# Patient Record
Sex: Male | Born: 1945 | Race: White | Hispanic: No
Health system: Midwestern US, Academic
[De-identification: ages and names within clinical notes are randomized; demographics above are authoritative.]

---

## 2017-02-26 ENCOUNTER — Encounter: Admit: 2017-02-26 | Discharge: 2017-02-26 | Payer: MEDICARE

## 2017-02-26 DIAGNOSIS — E7849 Other hyperlipidemia: ICD-10-CM

## 2017-02-26 DIAGNOSIS — R000 Tachycardia, unspecified: Principal | ICD-10-CM

## 2017-02-26 DIAGNOSIS — I2510 Atherosclerotic heart disease of native coronary artery without angina pectoris: ICD-10-CM

## 2017-02-28 ENCOUNTER — Encounter: Admit: 2017-02-28 | Discharge: 2017-02-28 | Payer: MEDICARE

## 2017-02-28 DIAGNOSIS — E119 Type 2 diabetes mellitus without complications: ICD-10-CM

## 2017-02-28 DIAGNOSIS — I2510 Atherosclerotic heart disease of native coronary artery without angina pectoris: ICD-10-CM

## 2017-02-28 DIAGNOSIS — Z87891 Personal history of nicotine dependence: Principal | ICD-10-CM

## 2017-02-28 DIAGNOSIS — R000 Tachycardia, unspecified: ICD-10-CM

## 2017-03-01 ENCOUNTER — Encounter: Admit: 2017-03-01 | Discharge: 2017-03-01 | Payer: MEDICARE

## 2017-03-01 DIAGNOSIS — E119 Type 2 diabetes mellitus without complications: ICD-10-CM

## 2017-03-01 DIAGNOSIS — I2510 Atherosclerotic heart disease of native coronary artery without angina pectoris: ICD-10-CM

## 2017-03-01 DIAGNOSIS — R000 Tachycardia, unspecified: ICD-10-CM

## 2017-03-01 DIAGNOSIS — Z87891 Personal history of nicotine dependence: Principal | ICD-10-CM

## 2017-03-01 NOTE — Progress Notes
Records Request    Medical records request for continuation of care:    Patient has appointment on 03/07/17   with  Dr. Trudi Ida* .    Please fax records to [facility]  [phone]    Request records: STAT    Cardiac Office Notes (Most recent)    EKG's           Cardiac Catheterization    Any Cardiac Testing    Any cardiac-related records    Recent Labs    H&P/Discharge Summary    Operative reports- Cardiac      Thank you,      [facility]  [facility]  [address]  [place], [place] [address]  Phone:  [phone]  Fax:  [phone]

## 2017-03-05 ENCOUNTER — Ambulatory Visit: Admit: 2017-03-05 | Discharge: 2017-03-06 | Payer: MEDICARE

## 2017-03-05 ENCOUNTER — Encounter: Admit: 2017-03-05 | Discharge: 2017-03-05 | Payer: MEDICARE

## 2017-03-05 DIAGNOSIS — R5382 Chronic fatigue, unspecified: ICD-10-CM

## 2017-03-05 DIAGNOSIS — I2510 Atherosclerotic heart disease of native coronary artery without angina pectoris: ICD-10-CM

## 2017-03-05 DIAGNOSIS — Z87891 Personal history of nicotine dependence: Principal | ICD-10-CM

## 2017-03-05 DIAGNOSIS — R000 Tachycardia, unspecified: ICD-10-CM

## 2017-03-05 DIAGNOSIS — E119 Type 2 diabetes mellitus without complications: ICD-10-CM

## 2017-03-05 DIAGNOSIS — R0600 Dyspnea, unspecified: ICD-10-CM

## 2017-03-05 DIAGNOSIS — R079 Chest pain, unspecified: ICD-10-CM

## 2017-03-07 ENCOUNTER — Encounter: Admit: 2017-03-07 | Discharge: 2017-03-07 | Payer: MEDICARE

## 2017-03-15 ENCOUNTER — Ambulatory Visit: Admit: 2017-03-15 | Discharge: 2017-03-16 | Payer: MEDICARE

## 2017-03-15 DIAGNOSIS — I2510 Atherosclerotic heart disease of native coronary artery without angina pectoris: Principal | ICD-10-CM

## 2017-03-15 DIAGNOSIS — R079 Chest pain, unspecified: ICD-10-CM

## 2017-03-15 NOTE — Progress Notes
Peripheral IV Insertion Note:  Patient Side: right  Line Orientation:Antecubital  IV Catheter Size: 22G  Number of Attempts:1.  IV capped and flushed with Normal Saline.  IV site without redness, swelling, or pain.  New dressing placed.    After procedure IV cannula removed intact and hemostasis achieved.

## 2017-03-19 ENCOUNTER — Encounter: Admit: 2017-03-19 | Discharge: 2017-03-19 | Payer: MEDICARE

## 2017-03-19 NOTE — Telephone Encounter
Called patient and reviewed results and recommendations.  He states he is NOT going to schedule the cath at this time.  I discussed at length the risks and still wants to hold off scheduling.  I encouraged him to stop smoking.  He reports he is feeling good and does not want to proceed with the cath.  He will call us when he is ready to schedule.   I reinforced if he has any sx to report to ER.  He verbalizes understanding.   Note sent to DJW.         Jeri Modena:   I am requesting your help in setting up a heart cath on Mr. James Roberts. I saw him on October 2. He has numerous risk factors and had recent thallium that was abnormal and suggestive of coronary disease.      I think the next best option is to see if he does actually have significant CAD. Could you all contact him and help arrange this. I will call him this afternoon and let him know that one of you will be contacting to help with these      Arrangements.    Thx    ///   DJW

## 2018-03-25 ENCOUNTER — Encounter: Admit: 2018-03-25 | Discharge: 2018-03-25 | Payer: MEDICARE

## 2019-02-20 IMAGING — CR CHEST
2 series · 2 of 2 positions shown · non-contrast
Comparison: none

[chest pa x-wise]
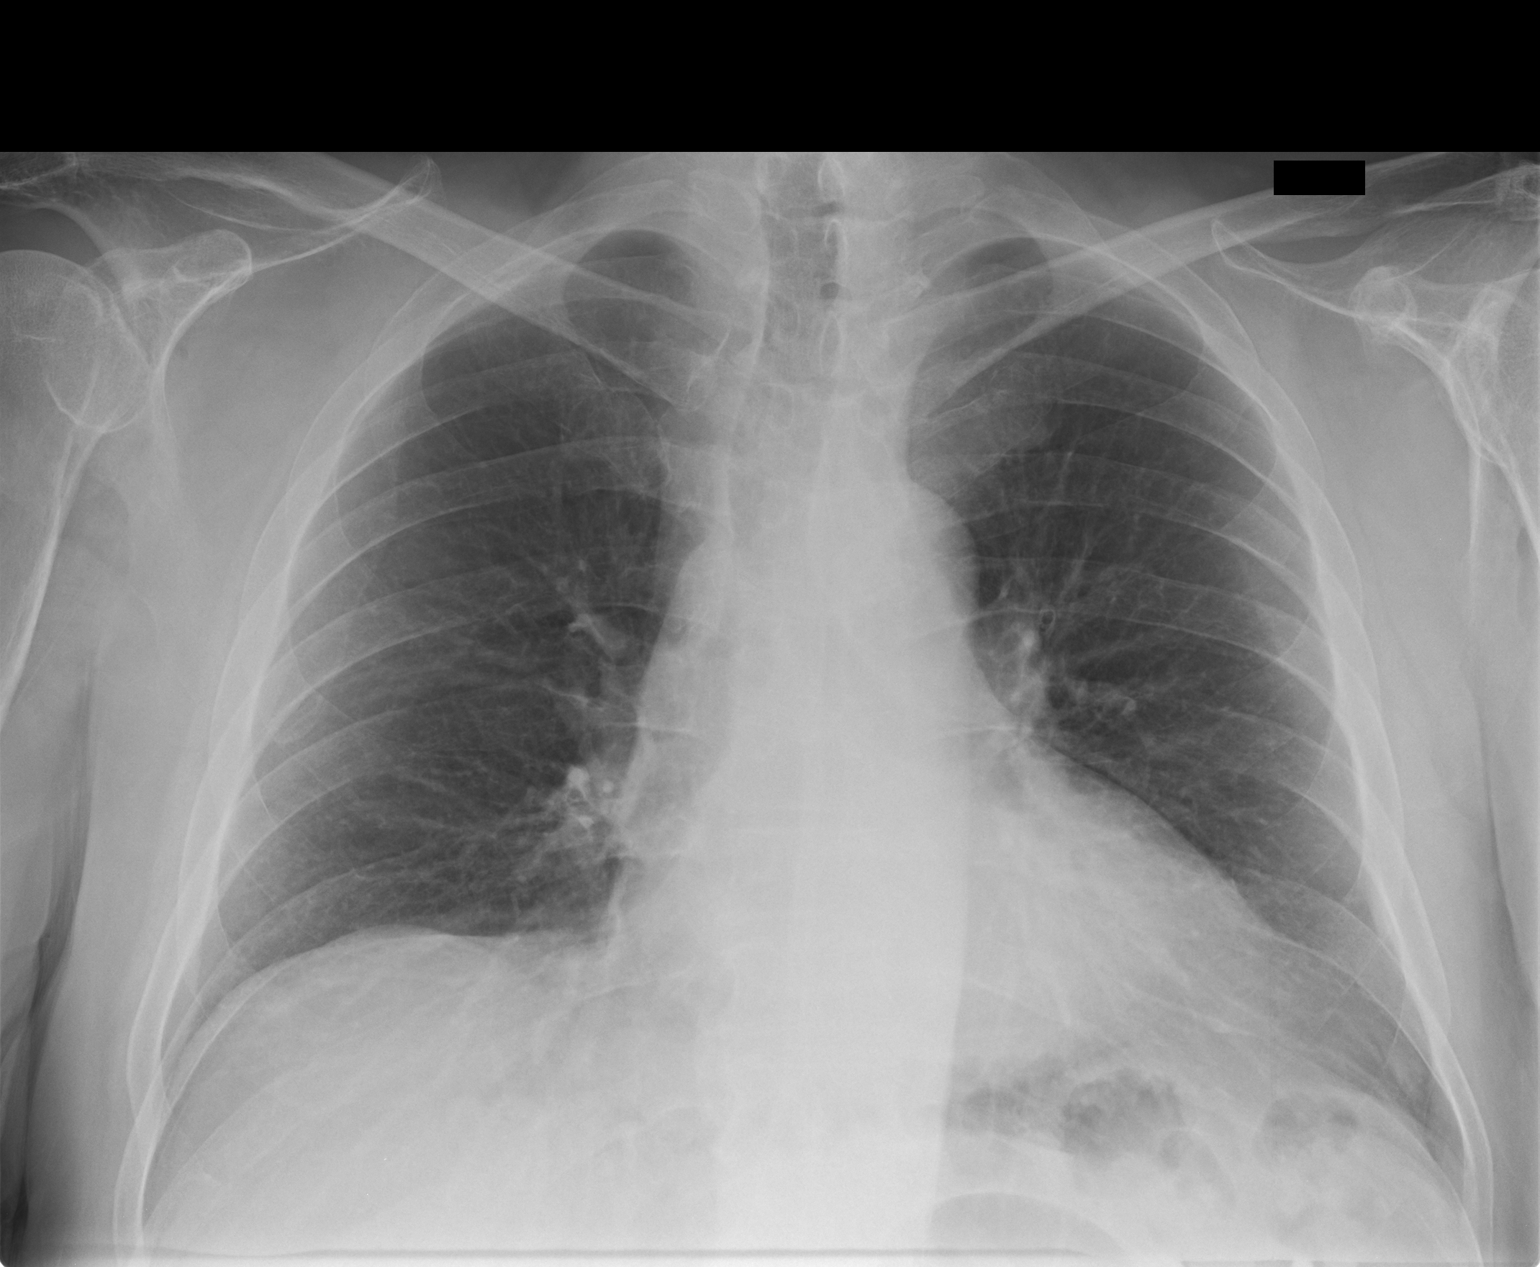

[chest lat]
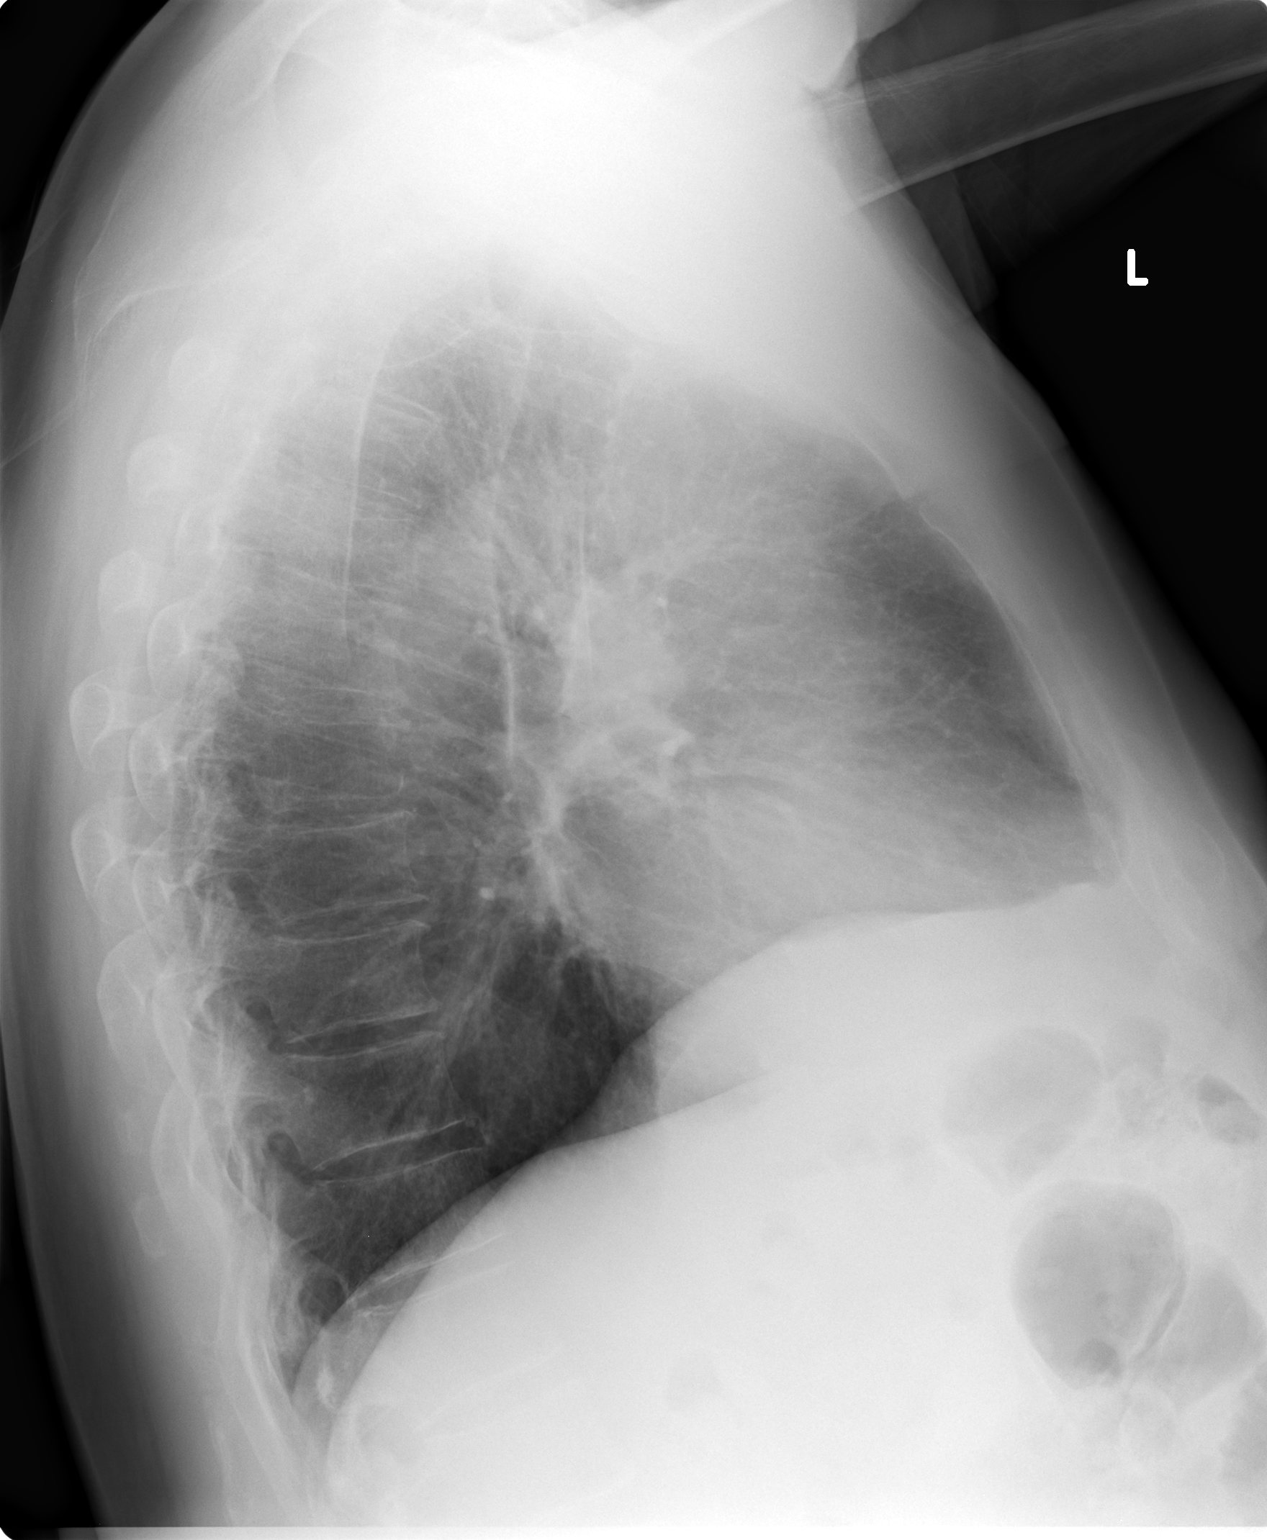

[2 of 2 positions shown; findings below may reference images not displayed]

DIAGNOSTIC STUDIES

EXAM

RADIOLOGICAL EXAMINATION, CHEST; 2 VIEWS FRONTAL AND LATERAL CPT 14565

INDICATION

cough
COUGH; HX OF SMOKING

TECHNIQUE

PA and lateral view chest

COMPARISONS

No prior studies are available for comparison.

FINDINGS

There is hyperinflation and chronic appearing interstitial changes. There is no focal consolidation
, effusion, or pneumothorax. Cardiac silhouette is within normal limits. The bony thorax is intact.

IMPRESSION

Mild chronic interstitial changes without evidence of dense consolidation.

## 2019-10-29 ENCOUNTER — Encounter: Admit: 2019-10-29 | Discharge: 2019-10-29 | Payer: MEDICARE

## 2019-10-29 DIAGNOSIS — R69 Illness, unspecified: Secondary | ICD-10-CM

## 2019-10-29 DIAGNOSIS — G9389 Other specified disorders of brain: Secondary | ICD-10-CM

## 2019-10-29 MED ORDER — ROSUVASTATIN 10 MG PO TAB
10 mg | Freq: Every day | ORAL | 0 refills | Status: DC
Start: 2019-10-29 — End: 2019-10-30
  Administered 2019-10-30: 15:00:00 10 mg via ORAL

## 2019-10-29 MED ORDER — ACETAMINOPHEN 325 MG PO TAB
650 mg | ORAL | 0 refills | Status: DC | PRN
Start: 2019-10-29 — End: 2019-10-30

## 2019-10-29 MED ORDER — MONTELUKAST 10 MG PO TAB
10 mg | Freq: Every evening | ORAL | 0 refills | Status: DC
Start: 2019-10-29 — End: 2019-10-30
  Administered 2019-10-30: 04:00:00 10 mg via ORAL

## 2019-10-29 MED ORDER — LISINOPRIL 10 MG PO TAB
5 mg | Freq: Every day | ORAL | 0 refills | Status: DC
Start: 2019-10-29 — End: 2019-10-30
  Administered 2019-10-30: 15:00:00 5 mg via ORAL

## 2019-10-29 MED ORDER — CHOLECALCIFEROL (VITAMIN D3) 25 MCG (1,000 UNIT) PO TAB
2000 [IU] | Freq: Every day | ORAL | 0 refills | Status: DC
Start: 2019-10-29 — End: 2019-10-30
  Administered 2019-10-30: 15:00:00 2000 [IU] via ORAL

## 2019-10-29 MED ORDER — CETIRIZINE 10 MG PO TAB
10 mg | Freq: Two times a day (BID) | ORAL | 0 refills | Status: DC
Start: 2019-10-29 — End: 2019-10-30
  Administered 2019-10-30 (×2): 10 mg via ORAL

## 2019-10-29 NOTE — Telephone Encounter
NEUROSURGERY ON CALL PHONE NOTE    I spoke to the [facility] regarding this 74 y.o. male who presented with intermittent right arm/leg weakness/weakness, dizziness. He presented to ED at [facility]. I reviewed images on the cloud. The quality is very poor. There is some kind of grid pattern superimposed over the image which obscures the detail. There is an abnormality of some kind in the left parietal lobe - tumor, stroke, abscess, etc. His spells sound most c/w seizure. He needs better quality imaging to determine if there is a neurosurgical problem present (MRI w/wo Gad seems like best choice). Transfer team plans to reach out to neurology (vs medicine) to discuss accepting the patient in transfer. I'll be happy to see after MRI if there's a neurosurgical indication.    Maryruth Eve, MD [facility]  Assistant Professor - Neurosurgery  [facility]  O: [phone]  F: [phone]  P: [phone]

## 2019-10-29 NOTE — Progress Notes
Neurological s/s including: numbness and inability to use right upper and right lower extremity, loss of balance, dizziness/x1 month. 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

## 2019-10-30 ENCOUNTER — Encounter: Admit: 2019-10-30 | Discharge: 2019-10-29 | Payer: MEDICARE

## 2019-10-30 ENCOUNTER — Inpatient Hospital Stay: Admit: 2019-10-30 | Discharge: 2019-10-30 | Payer: MEDICARE

## 2019-10-30 ENCOUNTER — Encounter: Admit: 2019-10-30 | Discharge: 2019-10-30 | Payer: MEDICARE

## 2019-10-30 ENCOUNTER — Inpatient Hospital Stay: Admit: 2019-10-30 | Payer: MEDICARE

## 2019-10-30 LAB — CBC AND DIFF
Lab: 0 10*3/uL (ref 0–0.20)
Lab: 0.3 10*3/uL (ref 0–0.45)
Lab: 1 % (ref 0–2)
Lab: 1.1 10*3/uL — ABNORMAL HIGH (ref 0–0.80)
Lab: 1.8 10*3/uL (ref 1.0–4.8)
Lab: 12 10*3/uL — ABNORMAL HIGH (ref 4.5–11.0)
Lab: 3 % (ref 0–5)
Lab: 8.6 10*3/uL — ABNORMAL HIGH (ref 1.8–7.0)
Lab: 9 % (ref 4–12)

## 2019-10-30 LAB — BASIC METABOLIC PANEL
Lab: 0.5 mg/dL (ref 0.4–1.24)
Lab: 105 MMOL/L (ref 98–110)
Lab: 106 mg/dL — ABNORMAL HIGH (ref 70–100)
Lab: 11 mg/dL (ref 7–25)
Lab: 138 MMOL/L (ref 137–147)
Lab: 25 MMOL/L (ref 21–30)
Lab: 4.8 MMOL/L (ref 3.5–5.1)
Lab: 8 pg (ref 3–12)
Lab: 9.6 mg/dL (ref 8.5–10.6)
Lab: >60 mL/min (ref >60)
Lab: >60 mL/min — ABNORMAL LOW (ref >60)

## 2019-10-30 MED ORDER — DESMOPRESSIN IVPB
.3 ug/kg | Freq: Once | INTRAVENOUS | 0 refills | Status: CP
Start: 2019-10-30 — End: ?
  Administered 2019-10-30 (×2): 23.012 ug via INTRAVENOUS

## 2019-10-30 MED ORDER — GADOBENATE DIMEGLUMINE 529 MG/ML (0.1MMOL/0.2ML) IV SOLN
15 mL | Freq: Once | INTRAVENOUS | 0 refills | Status: CP
Start: 2019-10-30 — End: ?
  Administered 2019-10-30: 07:00:00 15 mL via INTRAVENOUS

## 2019-10-30 MED ORDER — SODIUM CHLORIDE 0.9 % IJ SOLN
50 mL | Freq: Once | INTRAVENOUS | 0 refills | Status: CP
Start: 2019-10-30 — End: ?
  Administered 2019-10-30: 11:00:00 50 mL via INTRAVENOUS

## 2019-10-30 MED ORDER — DEXAMETHASONE 4 MG PO TAB
2 mg | ORAL_TABLET | Freq: Two times a day (BID) | ORAL | 0 refills | 12.50000 days | Status: AC
Start: 2019-10-30 — End: ?

## 2019-10-30 MED ORDER — IOHEXOL 350 MG IODINE/ML IV SOLN
100 mL | Freq: Once | INTRAVENOUS | 0 refills | Status: CP
Start: 2019-10-30 — End: ?
  Administered 2019-10-30: 11:00:00 100 mL via INTRAVENOUS

## 2019-10-30 MED ADMIN — SODIUM CHLORIDE 0.9 % IV SOLP [27838]: 250 mL | @ 15:00:00 | Stop: 2019-10-30 | NDC 00338004902

## 2019-10-30 NOTE — H&P (View-Only)
Admission History and Physical Examination      Name:  James Roberts                                             MRN:  [id number]   Admission Date:  10/29/2019                     Assessment/Plan:    Principal Problem:    Brain mass    Impression: James Roberts is a74 year old with pmh of diabetes and CAD who presents with right face and hand weakness with intermittent sensory changes with headache and confusion that have resolved with a large parietal mass. Differential is fairly wide as pt report history of stroke but unclear how this diagnosis was made as pt reports he was just in a office setting and was not imaged. Lesion could be a stroke or abscess but leading differential would be a tumor whether primary CNS versus MET with lung seeming most likely source.     Plan:    Neuro:  # RUE weakness  # Brain Mass    > MRI brain w/wo contrast to characterize mass based on this pt will likely need malignancy screen (CT C/A/P, possible LP, smear, etc)   > Hold Plavix until MRI resulted   > Continue PTA statin     # CAD  > PTA Lisinopril 5mg      #Diabetes  > pt unsure of what medications or doses he takes all PO medications but unsure which ones or dose chart review shows metformin 1000 mg and Glipizide 5 mg family to bring med list tmr   > BG checks    Diet: NPO until MRI resulted in case of surgery   PPX: SCDs holding Lovenox until MRI   Dispo: Pending MRI results    Pt discussed with Dr. Meredeth Ide to be staffed in AM    Silvestre Moment MD PGY3 Neurology     __________________________________________________________________________________  Primary Care Physician: James Roberts  Verified    Chief Complaint: Transfer for Brain Mass  History of Present Illness: James Roberts is a 74 y.o. male with pmh of type II diabetes and and CAD who presents as a transfer from [place] for a newly discovered brain mass.    Pt reports that starting about a month ago he started having right hand weakness, right facial droop, and some confusion at times he was told he had a stroke he does not remember getting head imaging at that time and was changed from ASA to Plavix. He says he has also had some intermittent numbness and tingling on and off in his right arm. He had an MRI on the 21st that showed a mass but pt was never called regarding results.     Today pt noticed his hand weakness and facial droop seemed a little worse and he had one of his episodes of numbness and tingling. Later in the morning he had a severe left sided headache and seemed to be a bit confused to family so they took him the OSH. His symptoms resolved by the time he arrived total duration approx 1 hour. He now has no headache is no longer confused and feels that his hand strength is the same it was a week ago. He does feel like his right facial droop might still be slightly worse.  No fever, loss of bowel/bladder, infectious symptoms. No unusual exposures, travel, history of HIV. Pt is a 20 year smoker with about a pack a week.     CT head from OSH showed a large left parietal heterogenous mass with surrounding vasogenic edema with effacement of the left lateral ventricle. No herniation, shift, or hemorrage seen. PT transferred to [facility] for MRI and Neurosurgery evaluation. NS reviewed outside CT requesting MRI before evaluation to determine if this is a neoplasm versus old  Stroke or abscess.     Medical History:   Diagnosis Date   ? CAD (coronary artery disease) 02/28/2017   ? Diabetes mellitus, type 2 (HCC) 02/28/2017   ? History of tobacco abuse 02/28/2017   ? Tachycardia 02/28/2017     No past surgical history on file.  Family history reviewed; non-contributory  Social History     Socioeconomic History   ? Marital status: Unknown     Spouse name: Not on file   ? Number of children: Not on file   ? Years of education: Not on file   ? Highest education level: Not on file   Occupational History   ? Not on file   Tobacco Use   ? Smoking status: Current Every Day Smoker     Packs/day: 0.25     Types: Cigarettes   ? Smokeless tobacco: Never Used   Substance and Sexual Activity   ? Alcohol use: No   ? Drug use: No   ? Sexual activity: Not on file   Other Topics Concern   ? Not on file   Social History Narrative   ? Not on file     Social Determinants of Health     Financial Resource Strain:    ? Difficulty of Paying Living Expenses:    Food Insecurity:    ? Worried About Programme researcher, broadcasting/film/video in the Last Year:    ? Barista in the Last Year:    Transportation Needs:    ? Freight forwarder (Medical):    ? Lack of Transportation (Non-Medical):    Physical Activity:    ? Days of Exercise per Week:    ? Minutes of Exercise per Session:    Stress:    ? Feeling of Stress :    Social Connections:    ? Frequency of Communication with Friends and Family:    ? Frequency of Social Gatherings with Friends and Family:    ? Attends Religious Services:    ? Active Member of Clubs or Organizations:    ? Attends Banker Meetings:    ? Marital Status:    Intimate Partner Violence:    ? Fear of Current or Ex-Partner:    ? Emotionally Abused:    ? Physically Abused:    ? Sexually Abused:                     Immunizations (includes history and patient reported):   There is no immunization history on file for this patient.        Allergies:  Patient has no known allergies.    Medications:  Medications Prior to Admission   Medication Sig   ? aspirin EC 325 mg tablet Take 325 mg by mouth daily. Take with food.   ? cetirizine (ZYRTEC) 10 mg tablet Take 10 mg by mouth twice daily.   ? Cholecalciferol (Vitamin D3) (VITAMIN D-3) 2,000 unit cap Take  by mouth.   ?  diphenhydrAMINE (BENADRYL ALLERGY) 25 mg tablet Take 25 mg by mouth every 6 hours as needed.   ? Garlic 1,000 mg cap Take  by mouth.   ? glipiZIDE (GLUCOTROL) 5 mg tablet Take 5 mg by mouth daily.   ? HYDROcodone/acetaminophen (NORCO) 7.5/325 mg tablet Take 1 tablet by mouth every 6 hours as needed for Pain   ? lisinopril (PRINIVIL; ZESTRIL) 5 mg tablet Take 5 mg by mouth daily.   ? montelukast (SINGULAIR) 10 mg tablet Take 10 mg by mouth at bedtime daily.   ? rosuvastatin (CRESTOR) 10 mg tablet Take 10 mg by mouth daily.   ? Saw Palmetto Fruit 450 mg cap Take  by mouth.   ? turmeric root extract 500 mg cap Take  by mouth.     Review of Systems:  All other systems reviewed and are negative.    Physical Exam:  Vital Signs: Last Filed In 24 Hours Vital Signs: 24 Hour Range   BP: 117/75 (05/27 2051)  Temp: 36.8 ?C (98.3 ?F) (05/27 2051)  Pulse: 91 (05/27 2051)  Respirations: 18 PER MINUTE (05/27 2051) BP: (117)/(75)   Temp:  [36.8 ?C (98.3 ?F)]   Pulse:  [91]   Respirations:  [18 PER MINUTE]           General physical exam:    HEENT: normocephalic, eyes open with no discharge, nares patent, oropharynx is clear with no lesions, palate intact  CV: regular rate  Chest: normal configuration, no respiratory distress  Ab: soft, non-distended  Skin: no rashes or lesions    Neuro exam:   Mental status: Awake, alert, and oriented to person, place, time and location/situation    Speech:    Normal Abnormal   Fluency x    Comprehension x    Articulation x    Repetition x    Naming x        Cranial Nerves:    Normal Abnormal   II Pupils equal (3mm), round, reactive. 3    III, IV, VI EOMI, no nystagmus    V Intact to LT in V1-3    VII  eye closure symmetrical  mild right facial droop    VIII Hearing intact to finger rub    IX, X Uvula midline and palate elevation symmetrical    XI Shrug equal     XII Tongue midline        Muscle/motor:   Tone: nml  Bulk: nml  Fasciculations: none  Pronator drift: absent   Right  Left   Right  Left    Shoulder abductors:  5  5 Hip flexors:  5  5   Elbow flexors:  5  5 Hip abductors:       Elbow extensors:  5  5 Hip extensors:      Wrist extensors:  5  5 Hip adductors:      Wrist flexors:  5  5  Knee flexors:  5  5   Finger flexors:  4+ 5 Knee extensors:  5  5   Finger extensors:  4+ 5 Ankle plantar flexors:  5  5   Finger abductors:  4+ 5 Ankle dorsiflexors:  5  5   Thumb abductors:  4+ 5 Ankle inversion:         Ankle eversion:          Toe extensors:          Toe flexors:       No abnormal movements, rigidity or  spasticity    Reflexes:    Right Left   Triceps 2 2   Biceps 3 2   Brachioradialis 3 2   Patella 1 1   Ankle 1 1   Plantar Flexor Flexor       Sensation:    Normal RUE LUE RLE LLE   Light Touch x       Pin Prick        Temperature x       Vibration x       Proprioception        Sensory level: absent    Coordination:    Normal Abnormal Right Abnormal Left   Finger to Nose x     Rapid alternating  x     Heel to [employer] tap      Foot tap      Other        Gait and Station:  Regular gait: normal base and arm swing  Toe/Heel walk: unable to perform due to balance baseline per pt     Lab/Radiology/Other Diagnostic Tests:  24-hour labs:  No results found for this visit on 10/29/19 (from the past 24 hour(s)).     Pertinent radiology reviewed.    Alfonzo Feller, MD  Pager [phone]

## 2019-11-06 ENCOUNTER — Encounter: Admit: 2019-11-06 | Discharge: 2019-11-06 | Payer: MEDICARE

## 2019-11-06 ENCOUNTER — Ambulatory Visit: Admit: 2019-11-06 | Discharge: 2019-11-07 | Payer: MEDICARE

## 2019-11-06 DIAGNOSIS — R000 Tachycardia, unspecified: Secondary | ICD-10-CM

## 2019-11-06 DIAGNOSIS — Z87891 Personal history of nicotine dependence: Secondary | ICD-10-CM

## 2019-11-06 DIAGNOSIS — E119 Type 2 diabetes mellitus without complications: Secondary | ICD-10-CM

## 2019-11-07 DIAGNOSIS — G9389 Other specified disorders of brain: Secondary | ICD-10-CM

## 2019-11-16 ENCOUNTER — Encounter: Admit: 2019-11-16 | Discharge: 2019-11-16 | Payer: MEDICARE

## 2019-11-16 NOTE — Telephone Encounter
Palliative care    I had a long conversation with the [facility] and talked about GOC and hospice,  The pt. Does not want surgery or tx.  They have spoken the patients. PCP.  I talked to him about setting up GOC for the future and looking at hospice for the future.     They will be making an appt to see the PCP today to talk about this.   The pt feels he will live 5 years, but the son was told would only live 2 weeks to 6 months.    I educated him on hospice and their goals for the patients is to live the rest of their lives at home.       His son did not want to make an appt with [place].  I told him to call PCP today and get in and they can help with the Steroid dosing

## 2020-01-03 DEATH — deceased

## 2021-08-09 ENCOUNTER — Encounter: Admit: 2021-08-09 | Discharge: 2021-08-09 | Payer: MEDICARE

## 2021-08-09 NOTE — Progress Notes
POST-MORTEM DOCUMENTATION     Patient Name:  James Roberts  MRN:  [id number]  DOB:  1946-02-23    Date of death, if known:  12-24-19  Location of death, if known: Home  How were you notified: [email or web address]  [email or web address]    Was hospice involved?: Unknown  Name of hospice involved, if known:   Date of hospice admission, if known:

## 2021-10-26 IMAGING — CT CT head/brain wo con
2 of 4 series · 12 of 47 positions shown, 15 images · non-contrast
Comparison: none

[Series 4: brain cor 5.00 hr40 s3 · coronal · 0.35mm/px · 3 of 48 slices shown]
[im 16/48  brain]
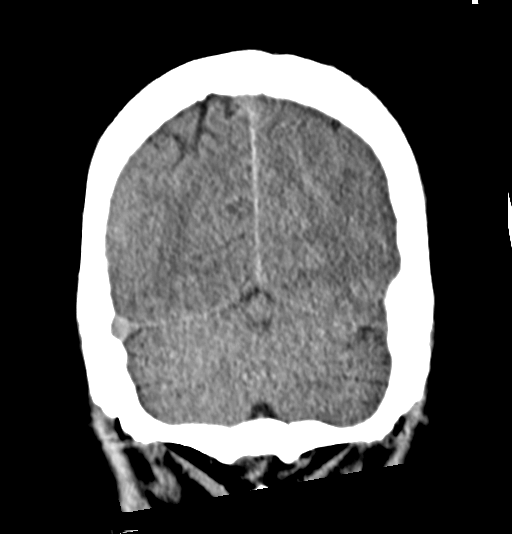
[im 21/48  brain]
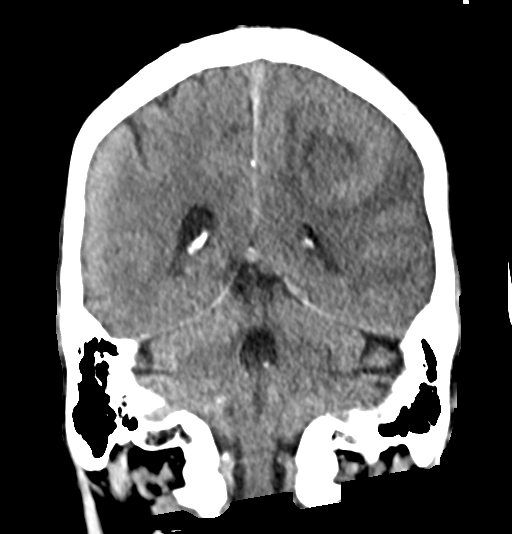
[im 27/48  brain]
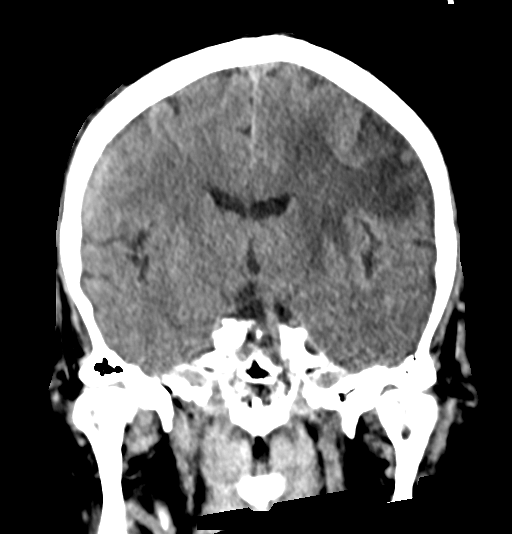

[Series 8: brain ax 2.00 hr60 s3 · axial · 0.35mm/px · z∈[-526,-386]mm · 9 of 86 slices shown, 12 images]
[im 9/86  brain]
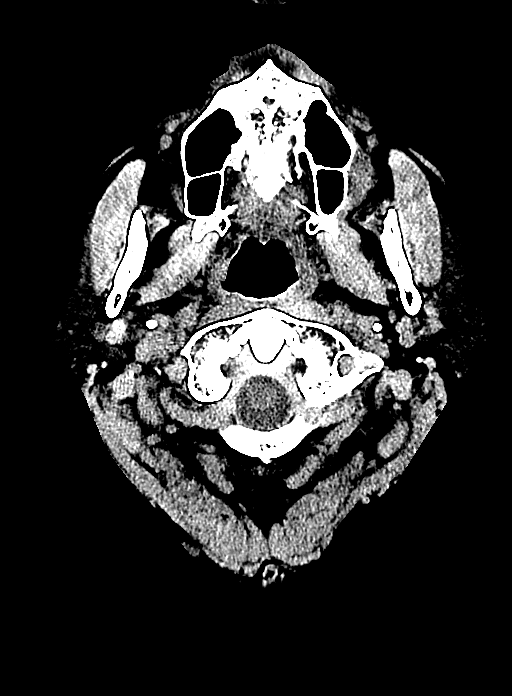
[im 9/86  bone]
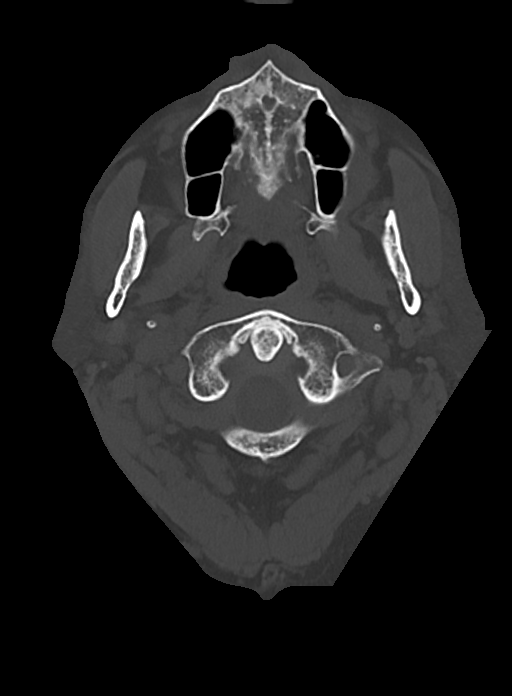
[im 18/86  brain]
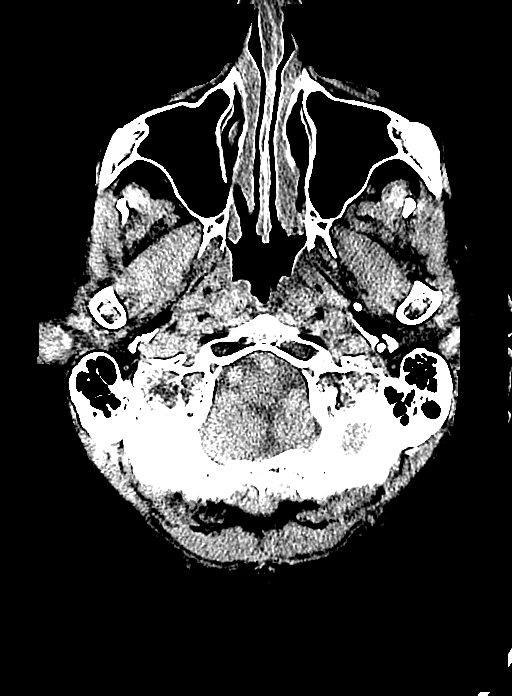
[im 26/86  brain]
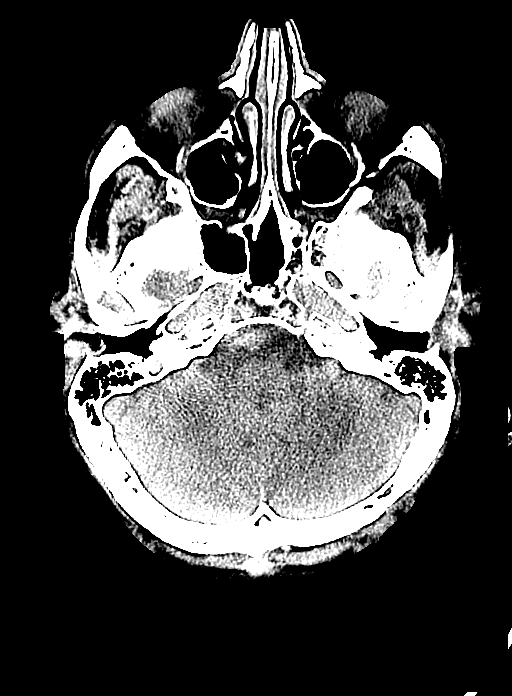
[im 35/86  brain]
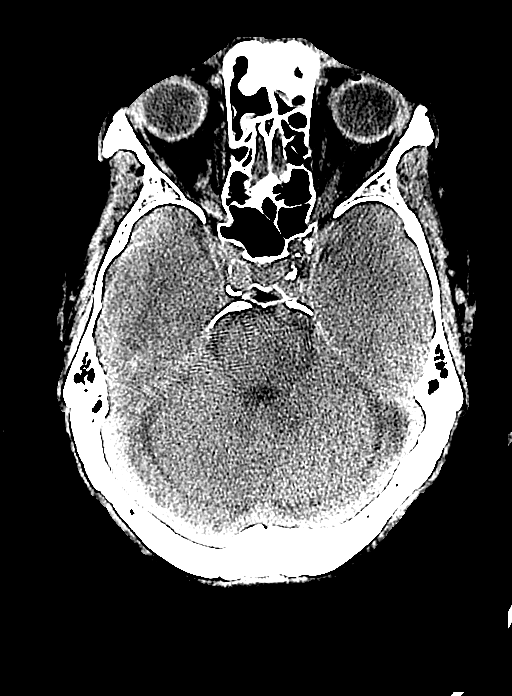
[im 43/86  brain]
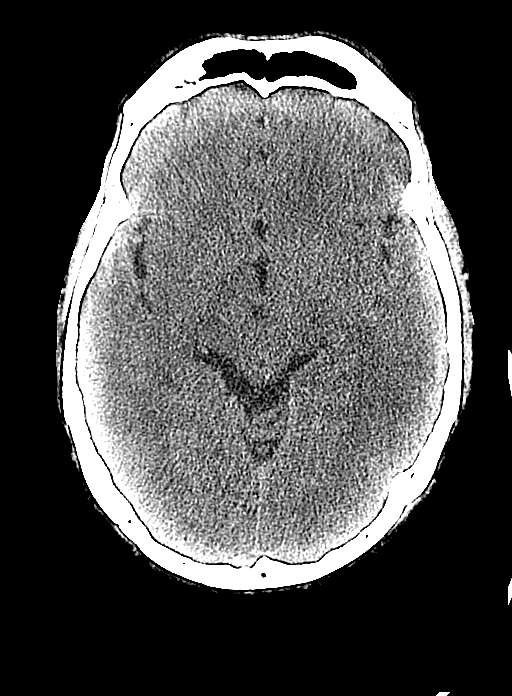
[im 43/86  bone]
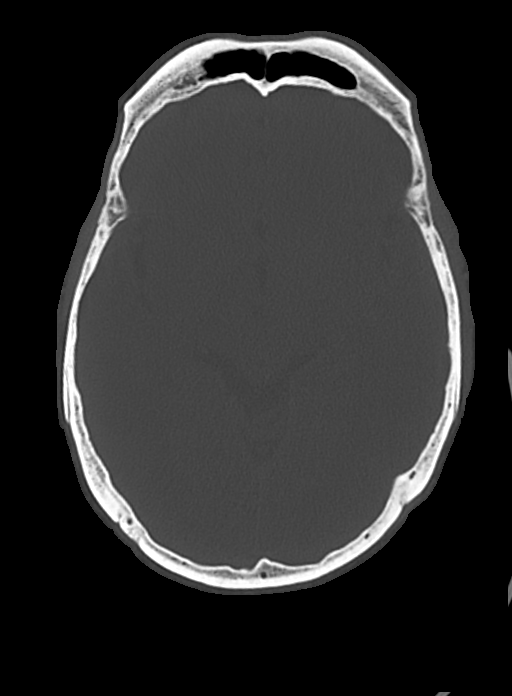
[im 52/86  brain]
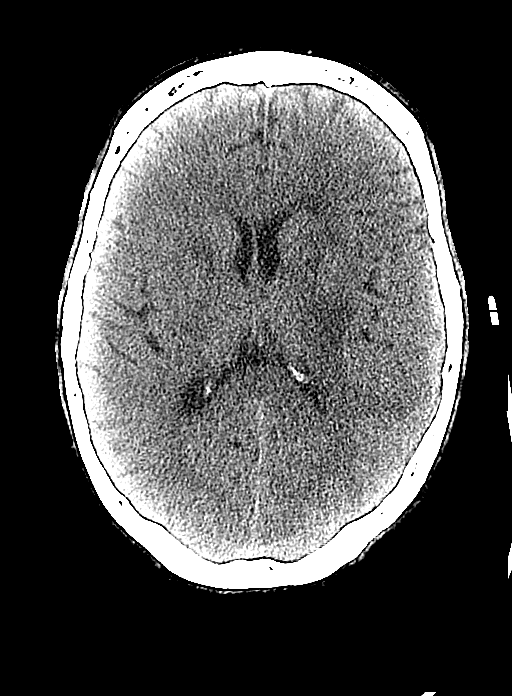
[im 60/86  brain]
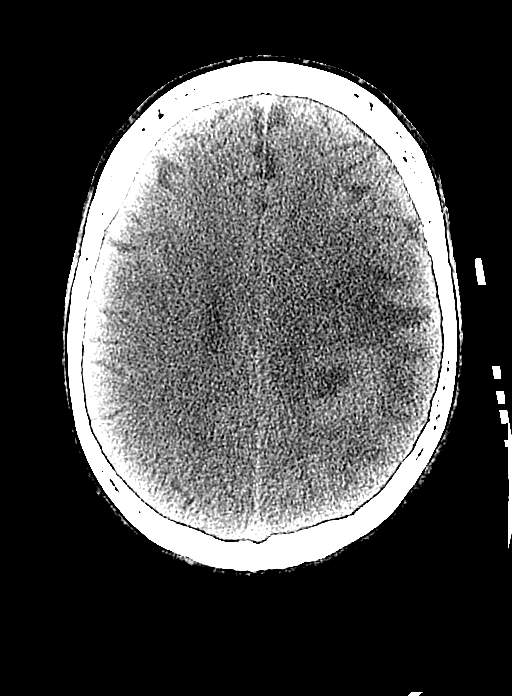
[im 69/86  brain]
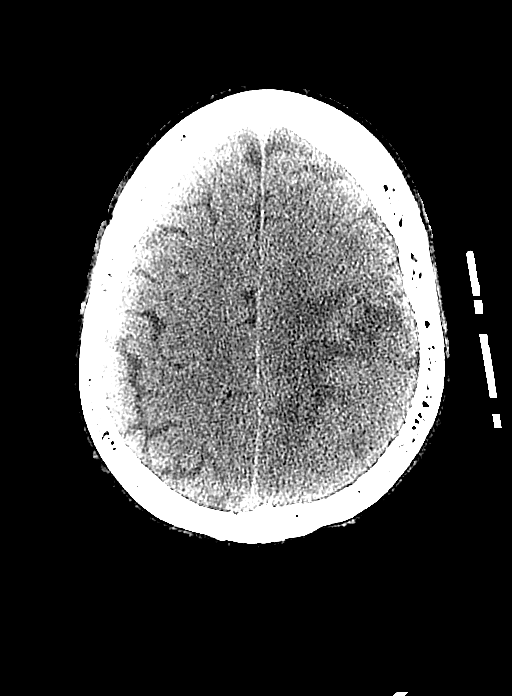
[im 77/86  brain]
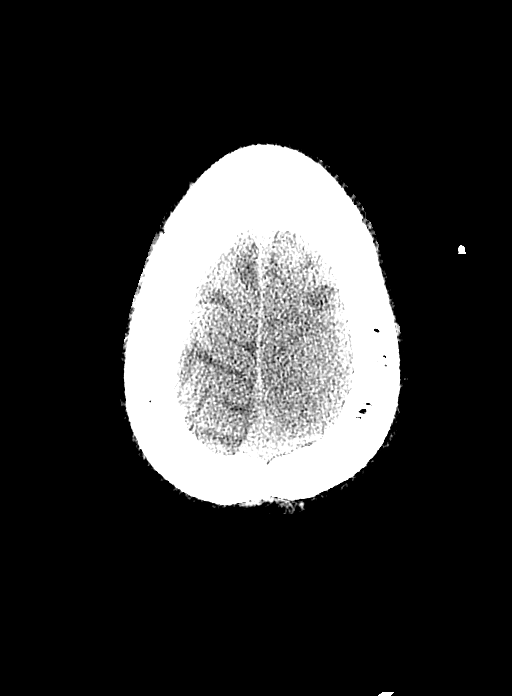
[im 77/86  bone]
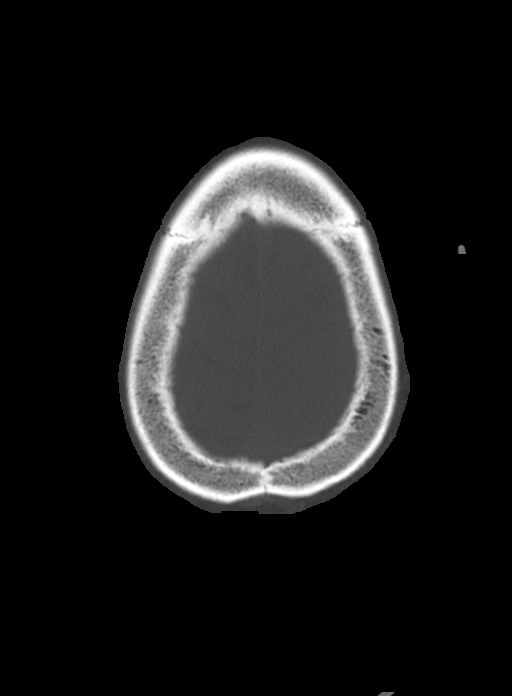

[12 of 47 positions shown; findings below may reference images not displayed]

EXAM

CT HEAD

INDICATION

Stroke

TECHNIQUE

All CT scans at this facility use dose modulation, iterative reconstruction, and/or weight based
dosing when appropriate to reduce radiation dose to as low as reasonably achievable.

CT of the head without contrast was performed. # of CT scans in the past year: 0 # of Myocardial
perfusion scans this past year: 0

COMPARISONS

MRI brain 10/23/2019

FINDINGS

Parenchyma: No interval acute hemorrhage. In correlation with prior MRI, heterogeneous mass with
significant surrounding vasogenic edema with a lesion centered within the posterior frontal parietal
lobe measuring up to 4.4 cm in craniocaudal extent (series 6, image 13). No midline shift. No
visualized herniation.

Ventricles / Extra-axial spaces: There is no hydrocephalus.  There are no extra-axial fluid
collections. Effacement of the left lateral ventricle secondary to subjacent vasogenic edema.
Effacement of the left posterior frontal and parietal sulci.

Other: The bony structures are intact.

IMPRESSION
1. In association with prior MR, large slightly hyperdense heterogeneous mass with significant
surrounding vasogenic edema which extends to the centrum semiovale and left posterior frontal
subcortical white matter tracts.
2. No significant midline shift, herniation, or acute hemorrhage at this time.
3. Effacement of the left lateral ventricle. No hydrocephalus.

Tech Notes:

PT STATES HAS LEFT SIDE HEADACHE. CT/NM 0/0. TJ
